# Patient Record
Sex: Female | Born: 1995 | Race: Black or African American | Hispanic: No | Marital: Single | Smoking: Never smoker
Health system: Southern US, Community
[De-identification: ages and names within clinical notes are randomized; demographics above are authoritative.]

## PROBLEM LIST (undated history)

## (undated) DIAGNOSIS — J020 Streptococcal pharyngitis: Secondary | ICD-10-CM

---

## 2017-03-07 ENCOUNTER — Ambulatory Visit (HOSPITAL_COMMUNITY)
Admission: EM | Admit: 2017-03-07 | Discharge: 2017-03-07 | Disposition: A | Discharge status: Home / Self Care | Payer: Medicaid Other | Attending: Family Medicine | Admitting: Family Medicine

## 2017-03-07 ENCOUNTER — Other Ambulatory Visit: Payer: Self-pay

## 2017-03-07 ENCOUNTER — Encounter (HOSPITAL_COMMUNITY): Payer: Self-pay | Admitting: *Deleted

## 2017-03-07 DIAGNOSIS — R0789 Other chest pain: Secondary | ICD-10-CM | POA: Diagnosis not present

## 2017-03-07 HISTORY — DX: Streptococcal pharyngitis: J02.0

## 2017-03-07 NOTE — Discharge Instructions (Signed)
Stop the azithromycin.    Mylanta or Maalox should help with the discomfort, which should resolve in 2 days

## 2017-03-07 NOTE — ED Provider Notes (Signed)
  [facility]   [id number] 03/07/17 Arrival Time: 1514   SUBJECTIVE:  Eileen Anderson is a 21 y.o. female who presents to the urgent care with complaint of constant left chest pain without radiation.  Occasionally feels SOB.  No change in pain with deep breathing; pain sometimes changes with movement.  She was diagnosed with strep throat on Thursday and started on azithromycin. She's had chest pain number since she started the azithromycin.  No longer has sore throat  Majoring in gaming   Past Medical History:  Diagnosis Date  . Strep pharyngitis    No family history on file. Social History   Socioeconomic History  . Marital status: Single    Spouse name: Not on file  . Number of children: Not on file  . Years of education: Not on file  . Highest education level: Not on file  Social Needs  . Financial resource strain: Not on file  . Food insecurity - worry: Not on file  . Food insecurity - inability: Not on file  . Transportation needs - medical: Not on file  . Transportation needs - non-medical: Not on file  Occupational History  . Not on file  Tobacco Use  . Smoking status: Never Smoker  Substance and Sexual Activity  . Alcohol use: No    Frequency: Never  . Drug use: Yes    Types: Marijuana  . Sexual activity: Yes    Birth control/protection: Pill  Other Topics Concern  . Not on file  Social History Narrative  . Not on file   Current Meds  Medication Sig  . UNKNOWN TO PATIENT BCPs   No Known Allergies    ROS: As per HPI, remainder of ROS negative.   OBJECTIVE:   Vitals:   03/07/17 1529  BP: 130/72  Pulse: 92  Resp: 16  Temp: 98.1 F (36.7 C)  TempSrc: Oral  SpO2: 100%     General appearance: alert; no distress Eyes: PERRL; EOMI; conjunctiva normal HENT: normocephalic; atraumatic; TMs normal, canal normal, external ears normal without trauma; nasal mucosa normal; oral mucosa normal Neck: supple Lungs: clear to auscultation  bilaterally Heart: regular rate and rhythm Abdomen: soft, non-tender; bowel sounds normal; no masses or organomegaly; no guarding or rebound tenderness Back: no CVA tenderness Extremities: no cyanosis or edema; symmetrical with no gross deformities Skin: warm and dry Neurologic: normal gait; grossly normal Psychological: alert and cooperative; normal mood and affect      Labs:  No results found for this or any previous visit.  Labs Reviewed - No data to display  No results found.     ASSESSMENT & PLAN:  1. Other chest pain     Meds ordered this encounter  Medications  . UNKNOWN TO PATIENT    Sig: BCPs    Reviewed expectations re: course of current medical issues. Questions answered. Outlined signs and symptoms indicating need for more acute intervention. Patient verbalized understanding. After Visit Summary given.    Procedures:      Elvina SidleLauenstein, Dewitt Judice, MD 03/07/17 1601

## 2017-03-07 NOTE — ED Triage Notes (Signed)
Pt finished last dose of azithromycin for strep throat today; States ever since starting abx, has had constant left chest pain without radiation.  Occasionally feels SOB.  No change in pain with deep breathing; pain sometimes changes with movement.

## 2017-06-08 DIAGNOSIS — M549 Dorsalgia, unspecified: Secondary | ICD-10-CM | POA: Insufficient documentation

## 2017-06-09 ENCOUNTER — Emergency Department (HOSPITAL_COMMUNITY)
Admission: EM | Admit: 2017-06-09 | Discharge: 2017-06-09 | Disposition: A | Discharge status: Home / Self Care | Payer: Medicaid Other | Attending: Emergency Medicine | Admitting: Emergency Medicine

## 2017-06-09 ENCOUNTER — Other Ambulatory Visit: Payer: Self-pay

## 2017-06-09 ENCOUNTER — Encounter (HOSPITAL_COMMUNITY): Payer: Self-pay | Admitting: Emergency Medicine

## 2017-06-09 DIAGNOSIS — M5489 Other dorsalgia: Secondary | ICD-10-CM

## 2017-06-09 LAB — POC URINE PREG, ED: Preg Test, Ur: NEGATIVE

## 2017-06-09 LAB — URINALYSIS, ROUTINE W REFLEX MICROSCOPIC
Bilirubin Urine: NEGATIVE
Glucose, UA: NEGATIVE mg/dL
Hgb urine dipstick: NEGATIVE
KETONES UR: NEGATIVE mg/dL
LEUKOCYTES UA: NEGATIVE
NITRITE: NEGATIVE
PH: 6 (ref 5.0–8.0)
Protein, ur: NEGATIVE mg/dL
SPECIFIC GRAVITY, URINE: 1.03 (ref 1.005–1.030)

## 2017-06-09 MED ORDER — CYCLOBENZAPRINE HCL 10 MG PO TABS: 10.0000 mg | ORAL_TABLET | Freq: Two times a day (BID) | ORAL | 0 refills | Status: DC | PRN

## 2017-06-09 NOTE — ED Provider Notes (Signed)
[facility]   CSN: [id number] Arrival date & time: 06/08/17  2343     History   Chief Complaint Chief Complaint  Patient presents with  . Back Pain    HPI Eileen Anderson is a 22 y.o. female.  Patient presents to the ED with a chief complaint back pain.  She states that she was squatting with a barbell today and felt a pain in her back.  She states that the pain radiates down her back.  She denies weakness or numbness.  Denies bowel or bladder incontinence.  Denies fever or chills.  Denies dysuria.  There are no aggravating or alleviating factors.   The history is provided by the patient. No language interpreter was used.    Past Medical History:  Diagnosis Date  . Strep pharyngitis     There are no active problems to display for this patient.   History reviewed. No pertinent surgical history.  OB History    No data available       Home Medications    Prior to Admission medications   Medication Sig Start Date End Date Taking? Authorizing Provider  UNKNOWN TO PATIENT BCPs    [provider]    Family History No family history on file.  Social History Social History   Tobacco Use  . Smoking status: Never Smoker  Substance Use Topics  . Alcohol use: No    Frequency: Never  . Drug use: Yes    Types: Marijuana     Allergies   Patient has no known allergies.   Review of Systems Review of Systems  All other systems reviewed and are negative.    Physical Exam Updated Vital Signs BP (!) 111/47 (BP Location: Right Arm)   Pulse 76   Temp 97.9 F (36.6 C) (Oral)   Resp 17   Ht 5\' 2"  (1.575 m)   Wt 66.7 kg (147 lb)   SpO2 95%   BMI 26.89 kg/m   Physical Exam Physical Exam  Constitutional: Pt appears well-developed and well-nourished. No distress.  HENT:  Head: Normocephalic and atraumatic.  Mouth/Throat: Oropharynx is clear and moist. No oropharyngeal exudate.  Eyes:  Conjunctivae are normal.  Neck: Normal range of motion. Neck supple.  No meningismus Cardiovascular: Normal rate, regular rhythm and intact distal pulses.   Pulmonary/Chest: Effort normal and breath sounds normal. No respiratory distress. Pt has no wheezes.  Abdominal: Pt exhibits no distension Musculoskeletal:  Lumbar paraspinal muscle tenderness to palpation, no bony CTLS spine tenderness, deformity, step-off, or crepitus Lymphadenopathy: Pt has no cervical adenopathy.  Neurological: Pt is alert and oriented Speech is clear and goal oriented, follows commands Normal 5/5 strength in upper and lower extremities bilaterally including dorsiflexion and plantar flexion, strong and equal grip strength Sensation intact Great toe extension intact Moves extremities without ataxia, coordination intact Ankle and knee jerk reflexes intact and symmetrical  Normal gait Normal balance No Clonus Skin: Skin is warm and dry. No rash noted. Pt is not diaphoretic. No erythema.  Psychiatric: Pt has a normal mood and affect. Behavior is normal.  Nursing note and vitals reviewed.   ED Treatments / Results  Labs (all labs ordered are listed, but only abnormal results are displayed) Labs Reviewed  URINALYSIS, ROUTINE W REFLEX MICROSCOPIC - Abnormal; Notable for the following components:      Result Value   APPearance HAZY (*)    All other components within normal limits  POC URINE PREG, ED  EKG  EKG Interpretation None       Radiology No results found.  Procedures Procedures (including critical care time)  Medications Ordered in ED Medications - No data to display   Initial Impression / Assessment and Plan / ED Course  I have reviewed the triage vital signs and the nursing notes.  Pertinent labs & imaging results that were available during my care of the patient were reviewed by me and considered in my medical decision making (see chart for details).     Patient with back pain.      No neurological deficits and normal neuro exam.  Patient is ambulatory.  No loss of bowel or bladder control.  Doubt cauda equina.  Denies fever,  doubt epidural abscess or other lesion. Recommend back exercises, stretching, RICE, and will treat with a short course of flexeril.  Encouraged the patient that there could be a need for additional workup and/or imaging such as MRI, if the symptoms do not resolve. Patient advised that if the back pain does not resolve, or radiates, this could progress to more serious conditions and is encouraged to follow-up with PCP or orthopedics within 2 weeks.     Final Clinical Impressions(s) / ED Diagnoses   Final diagnoses:  Midline back pain, unspecified back location, unspecified chronicity    ED Discharge Orders        Ordered    cyclobenzaprine (FLEXERIL) 10 MG tablet  2 times daily PRN     06/09/17 0505       Roxy Horseman, PA-C 06/09/17 0505    Palumbo, April, MD 06/09/17 [phone]

## 2017-06-09 NOTE — ED Triage Notes (Signed)
Pt has hx of scoiolis, was working out today and initially felt a "tingle" in her neck.  Since then she has had increasing pain in her lower back.  OTC medications have not helped w/ the pain.

## 2018-01-05 ENCOUNTER — Ambulatory Visit
Admission: RE | Admit: 2018-01-05 | Discharge: 2018-01-05 | Disposition: A | Discharge status: Home / Self Care | Payer: Medicaid Other | Source: Ambulatory Visit | Attending: Nurse Practitioner | Admitting: Nurse Practitioner

## 2018-01-05 ENCOUNTER — Other Ambulatory Visit: Payer: Self-pay | Admitting: Nurse Practitioner

## 2018-01-05 DIAGNOSIS — M419 Scoliosis, unspecified: Secondary | ICD-10-CM

## 2018-01-18 ENCOUNTER — Encounter (HOSPITAL_COMMUNITY): Payer: Self-pay

## 2018-01-18 ENCOUNTER — Ambulatory Visit (HOSPITAL_COMMUNITY)
Admission: EM | Admit: 2018-01-18 | Discharge: 2018-01-18 | Disposition: A | Discharge status: Home / Self Care | Payer: BLUE CROSS/BLUE SHIELD | Attending: Family Medicine | Admitting: Family Medicine

## 2018-01-18 ENCOUNTER — Other Ambulatory Visit: Payer: Self-pay

## 2018-01-18 DIAGNOSIS — M62830 Muscle spasm of back: Secondary | ICD-10-CM | POA: Diagnosis not present

## 2018-01-18 MED ORDER — IBUPROFEN 800 MG PO TABS: 800.0000 mg | ORAL_TABLET | Freq: Three times a day (TID) | ORAL | 0 refills | Status: DC

## 2018-01-18 MED ORDER — CYCLOBENZAPRINE HCL 5 MG PO TABS: 5.0000 mg | ORAL_TABLET | Freq: Three times a day (TID) | ORAL | 0 refills | Status: DC | PRN

## 2018-01-18 NOTE — ED Provider Notes (Signed)
[facility]    CSN: [id number] Arrival date & time: 01/18/18  1616     History   Chief Complaint Chief Complaint  Patient presents with  . Back Pain    HPI Eileen Anderson is a 22 y.o. female.   HPI  Patient is here for back pain.  Is been present for 2 weeks.  She is in [place].  They did a physical fitness test.  She was competing with other students.  She did activities that she was not accustomed to and had muscle pain the next day.  This has persisted. Pain at the mid to low back.  No numbness or weakness.  No bowel or bladder complaint.  She does have some underlying scoliosis.  Past Medical History:  Diagnosis Date  . Strep pharyngitis     There are no active problems to display for this patient.   History reviewed. No pertinent surgical history.  OB History   None      Home Medications    Prior to Admission medications   Medication Sig Start Date End Date Taking? Authorizing Provider  cyclobenzaprine (FLEXERIL) 5 MG tablet Take 1 tablet (5 mg total) by mouth 3 (three) times daily as needed for muscle spasms. 01/18/18   Eustace Moore, MD  ibuprofen (ADVIL,MOTRIN) 800 MG tablet Take 1 tablet (800 mg total) by mouth 3 (three) times daily. 01/18/18   Eustace Moore, MD  UNKNOWN TO PATIENT BCPs    [provider]    Family History History reviewed. No pertinent family history. No family history heart disease, cancer in parents Social History Social History   Tobacco Use  . Smoking status: Never Smoker  . Smokeless tobacco: Never Used  Substance Use Topics  . Alcohol use: No    Frequency: Never  . Drug use: Yes    Types: Marijuana     Allergies   Patient has no known allergies.   Review of Systems Review of Systems  Constitutional: Negative for chills and fever.  HENT: Negative for ear pain and sore throat.   Eyes: Negative for pain and visual disturbance.  Respiratory: Negative for cough and shortness of breath.     Cardiovascular: Negative for chest pain and palpitations.  Gastrointestinal: Negative for abdominal pain and vomiting.  Genitourinary: Negative for dysuria and hematuria.  Musculoskeletal: Positive for back pain. Negative for arthralgias.  Skin: Negative for color change and rash.  Neurological: Negative for seizures and syncope.  All other systems reviewed and are negative.    Physical Exam Triage Vital Signs ED Triage Vitals  Enc Vitals Group     BP 01/18/18 1643 (!) 114/55     Pulse Rate 01/18/18 1643 80     Resp 01/18/18 1643 20     Temp 01/18/18 1643 98 F (36.7 C)     Temp Source 01/18/18 1643 Oral     SpO2 01/18/18 1643 100 %     Weight 01/18/18 1700 152 lb (68.9 kg)     Height --      Head Circumference --      Peak Flow --      Pain Score --      Pain Loc --      Pain Edu? --      Excl. in GC? --    No data found.  Updated Vital Signs BP (!) 102/58 (BP Location: Right Arm)   Pulse 80   Temp 98.3 F (36.8 C) (Oral)   Resp  18   Wt 68.9 kg   LMP 01/18/2018   SpO2 100%   BMI 27.80 kg/m   Visual Acuity Right Eye Distance:   Left Eye Distance:   Bilateral Distance:    Right Eye Near:   Left Eye Near:    Bilateral Near:     Physical Exam  Constitutional: She appears well-developed and well-nourished. No distress.  HENT:  Head: Normocephalic and atraumatic.  Mouth/Throat: Oropharynx is clear and moist.  Eyes: Pupils are equal, round, and reactive to light. Conjunctivae are normal.  Neck: Normal range of motion.  Cardiovascular: Normal rate.  Pulmonary/Chest: Effort normal. No respiratory distress.  Abdominal: Soft. She exhibits no distension.  Musculoskeletal: Normal range of motion. She exhibits no edema.  Neurological: She is alert.  Lumbar spine is straight and symmetric. Full range of motion. No muscle spasm. Strength, sensation, range of motion, and reflexes are normal in both lower extremities. Straight leg raise is negative bilateral. Mild  tenderness lumbar muscles   Skin: Skin is warm and dry.  Psychiatric: She has a normal mood and affect. Her behavior is normal.     UC Treatments / Results  Labs (all labs ordered are listed, but only abnormal results are displayed) Labs Reviewed - No data to display  EKG None  Radiology No results found.  Procedures Procedures (including critical care time)  Medications Ordered in UC Medications - No data to display  Initial Impression / Assessment and Plan / UC Course  I have reviewed the triage vital signs and the nursing notes.  Pertinent labs & imaging results that were available during my care of the patient were reviewed by me and considered in my medical decision making (see chart for details).     Discussed no need for imaging with nontraumatic muscle pain Final Clinical Impressions(s) / UC Diagnoses   Final diagnoses:  Muscle spasm of back     Discharge Instructions     Take ibuprofen 3 times a day with food Take cyclobenzaprine as needed for muscle relaxing Caution drowsiness on the cyclobenzaprine Use ice or heat to sore back muscles Activity as tolerated.  Stretching and gentle movements are advised, no heavy lifting or heavy use of muscles for an additional week or 2 Return if you fail to improve as expected   ED Prescriptions    Medication Sig Dispense Auth. Provider   cyclobenzaprine (FLEXERIL) 5 MG tablet Take 1 tablet (5 mg total) by mouth 3 (three) times daily as needed for muscle spasms. 30 tablet Eustace Moore, MD   ibuprofen (ADVIL,MOTRIN) 800 MG tablet Take 1 tablet (800 mg total) by mouth 3 (three) times daily. 21 tablet Eustace Moore, MD     Controlled Substance Prescriptions [place] Controlled Substance Registry consulted? Not Applicable   Eustace Moore, MD 01/18/18 1759

## 2018-01-18 NOTE — ED Triage Notes (Signed)
Pt states she has back pain x 2 weeks.  

## 2018-01-18 NOTE — Discharge Instructions (Signed)
Take ibuprofen 3 times a day with food Take cyclobenzaprine as needed for muscle relaxing Caution drowsiness on the cyclobenzaprine Use ice or heat to sore back muscles Activity as tolerated.  Stretching and gentle movements are advised, no heavy lifting or heavy use of muscles for an additional week or 2 Return if you fail to improve as expected

## 2019-03-29 IMAGING — DX DG SCOLIOSIS EVAL COMPLETE SPINE 1V
1 series · 1 of 1 positions shown · non-contrast
Comparison: None.

CLINICAL DATA: Scoliosis evaluation.

EXAM:
DG SCOLIOSIS EVAL COMPLETE SPINE 1V

[dg scoliosis ap]
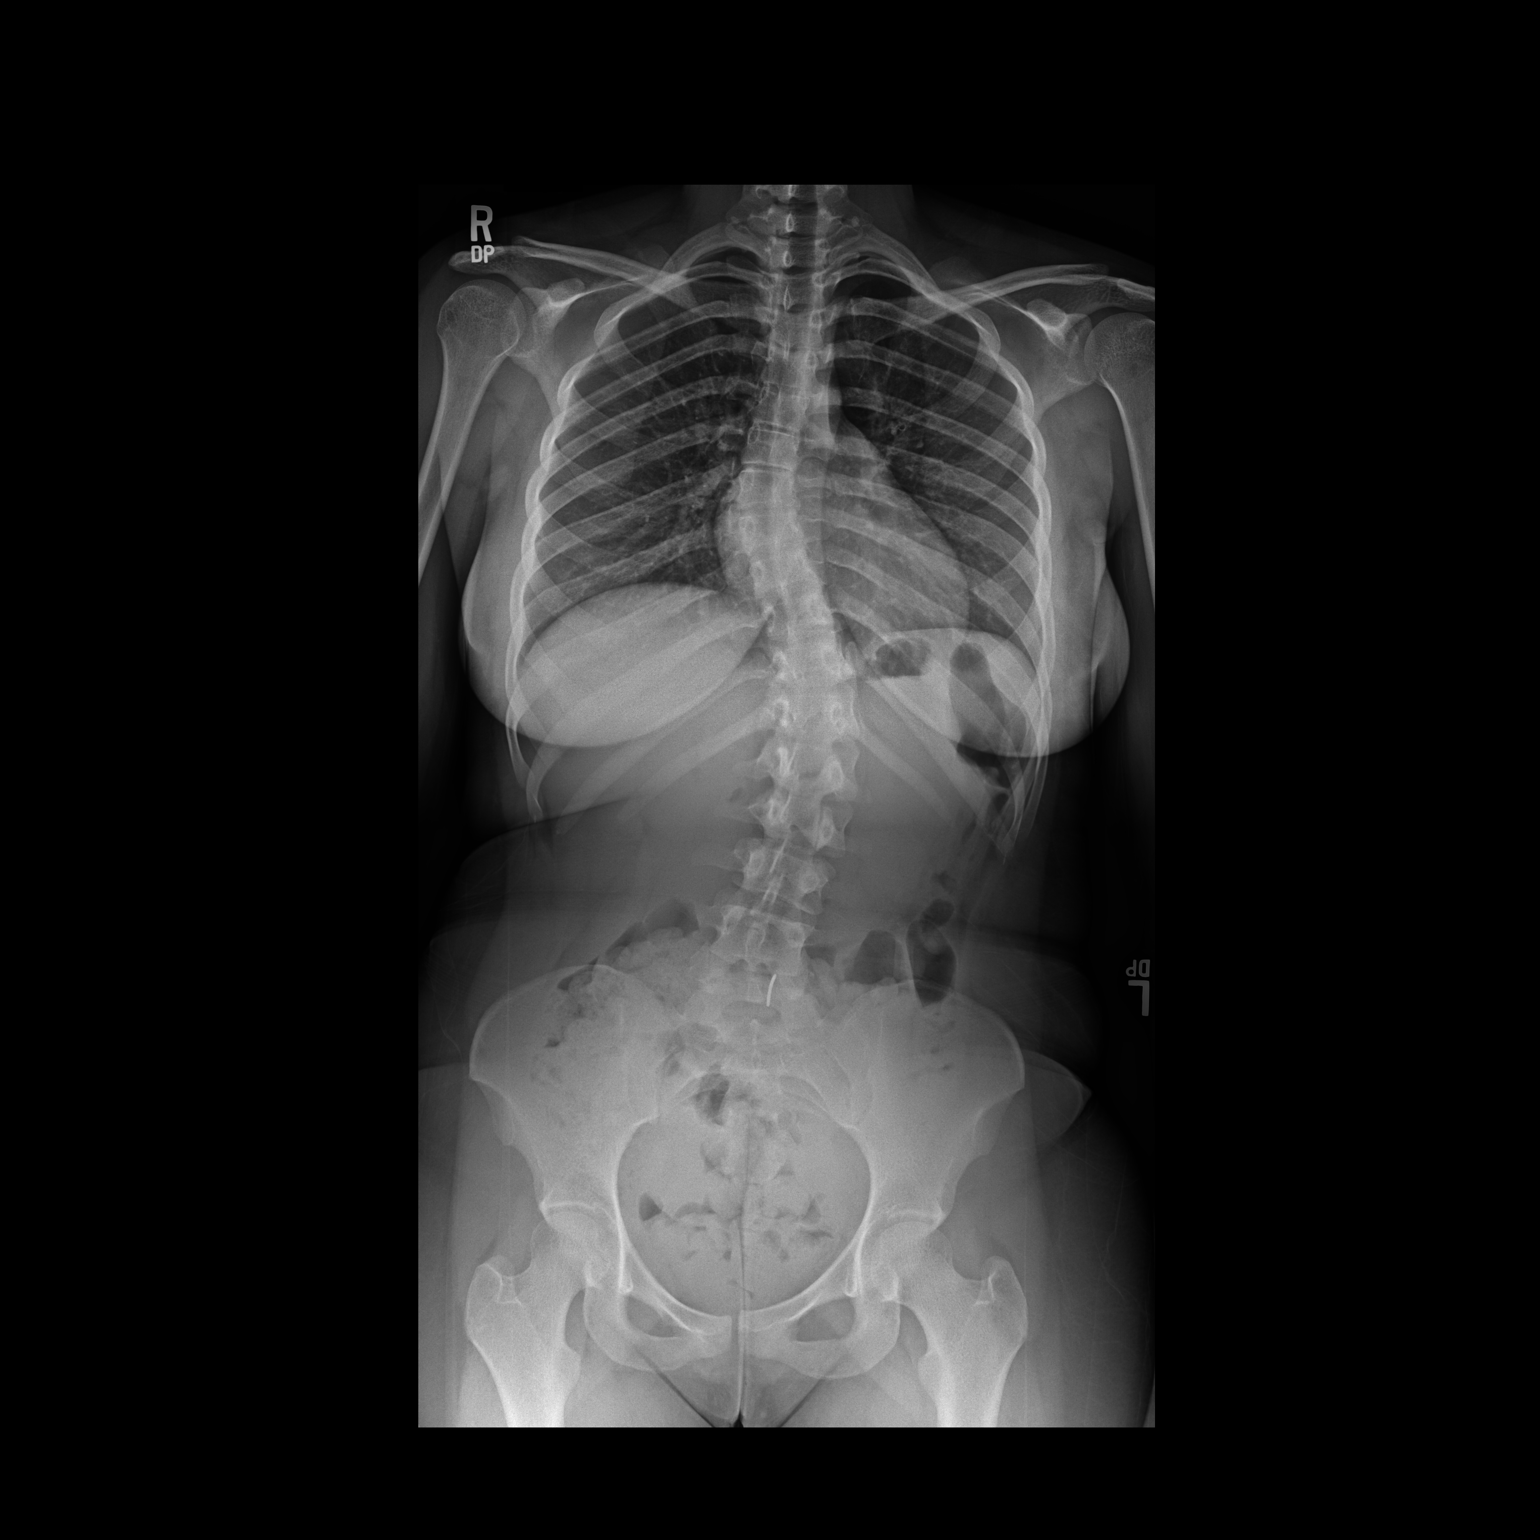

[1 of 1 positions shown; findings below may reference images not displayed]

FINDINGS: There is moderate S-shaped long segment thoracolumbar scoliosis. The
thoracic dextrocurvature component demonstrates a Cobb angle of 25
degrees as measured between the superior T5 and T10 vertebral
endplates. The lumbar levocurvature component demonstrates a Cobb
angle of 30 degrees as measured between the superior T11 and L3
vertebral endplates. No fracture or suspicious focal osseous lesion.
No evidence of segmentation anomalies.
IMPRESSION: Moderate S-shaped long segment thoracolumbar scoliosis as detailed.

## 2019-12-14 ENCOUNTER — Ambulatory Visit (HOSPITAL_COMMUNITY)
Admission: EM | Admit: 2019-12-14 | Discharge: 2019-12-14 | Disposition: A | Discharge status: Home / Self Care | Payer: BC Managed Care – PPO | Attending: Family Medicine | Admitting: Family Medicine

## 2019-12-14 ENCOUNTER — Other Ambulatory Visit: Payer: Self-pay

## 2019-12-14 ENCOUNTER — Encounter (HOSPITAL_COMMUNITY): Payer: Self-pay | Admitting: Emergency Medicine

## 2019-12-14 DIAGNOSIS — J069 Acute upper respiratory infection, unspecified: Secondary | ICD-10-CM

## 2019-12-14 DIAGNOSIS — U071 COVID-19: Secondary | ICD-10-CM | POA: Insufficient documentation

## 2019-12-14 LAB — SARS CORONAVIRUS 2 (TAT 6-24 HRS): SARS Coronavirus 2: POSITIVE — AB

## 2019-12-14 NOTE — Discharge Instructions (Signed)
Go home to rest Drink plenty of fluids Take Tylenol for pain or fever You may take over-the-counter cough and cold medicines as needed You must quarantine at home until your test result is available You can check for your test result in MyChart  

## 2019-12-14 NOTE — ED Triage Notes (Signed)
Pt c/o loss of taste and smell and nasal congestion since Sunday. Pt states she has been taking Nyquil and thera flu with no relief.

## 2019-12-14 NOTE — ED Provider Notes (Signed)
[facility]    CSN: [id number] Arrival date & time: 12/14/19  1027      History   Chief Complaint Chief Complaint  Patient presents with  . loss of taste/smell  . Nasal Congestion    HPI Eileen Anderson is a 24 y.o. female.   HPI  Patient here for loss of taste and smell, nasal congestion since Sunday (5 days).  No fever or chills.  No body aches or headache.  No shortness of breath, or cough. No known exposure to Covid Patient works at [employer] No one else at home is sick She has been taking over-the-counter cough and cold medicine with some improvement  Past Medical History:  Diagnosis Date  . Strep pharyngitis     There are no problems to display for this patient.   History reviewed. No pertinent surgical history.  OB History   No obstetric history on file.      Home Medications    Prior to Admission medications   Medication Sig Start Date End Date Taking? Authorizing Provider  Norethin Ace-Eth Estrad-FE 1-20 MG-MCG(24) CHEW Chew by mouth. 09/25/19  Yes [provider]  fluticasone (FLONASE) 50 MCG/ACT nasal spray 1 spray by Both Nostrils route daily.    [provider]    Family History Family History  Problem Relation Age of Onset  . Healthy Mother   . Healthy Father     Social History Social History   Tobacco Use  . Smoking status: Never Smoker  . Smokeless tobacco: Never Used  Vaping Use  . Vaping Use: Never used  Substance Use Topics  . Alcohol use: No  . Drug use: Not Currently     Allergies   Patient has no known allergies.   Review of Systems Review of Systems See HPI  Physical Exam Triage Vital Signs ED Triage Vitals  Enc Vitals Group     BP 12/14/19 1239 109/64     Pulse Rate 12/14/19 1239 64     Resp 12/14/19 1239 16     Temp 12/14/19 1239 97.8 F (36.6 C)     Temp Source 12/14/19 1239 Oral     SpO2 12/14/19 1239 100 %     Weight --      Height --      Head Circumference --      Peak  Flow --      Pain Score 12/14/19 1237 0     Pain Loc --      Pain Edu? --      Excl. in GC? --    No data found.  Updated Vital Signs BP 109/64 (BP Location: Left Arm)   Pulse 64   Temp 97.8 F (36.6 C) (Oral)   Resp 16   LMP 11/13/2019   SpO2 100%      Physical Exam Constitutional:      General: She is not in acute distress.    Appearance: She is well-developed and normal weight.  HENT:     Head: Normocephalic and atraumatic.     Nose: No congestion.     Mouth/Throat:     Mouth: Mucous membranes are moist.     Pharynx: No posterior oropharyngeal erythema.  Eyes:     Conjunctiva/sclera: Conjunctivae normal.     Pupils: Pupils are equal, round, and reactive to light.  Cardiovascular:     Rate and Rhythm: Normal rate and regular rhythm.     Heart sounds: Normal heart sounds.  Pulmonary:  Effort: Pulmonary effort is normal. No respiratory distress.     Breath sounds: No wheezing or rales.     Comments: Lungs are clear Musculoskeletal:        General: Normal range of motion.     Cervical back: Normal range of motion.  Skin:    General: Skin is warm and dry.  Neurological:     Mental Status: She is alert.  Psychiatric:        Behavior: Behavior normal.      UC Treatments / Results  Labs (all labs ordered are listed, but only abnormal results are displayed) Labs Reviewed  SARS CORONAVIRUS 2 (TAT 6-24 HRS)    EKG   Radiology No results found.  Procedures Procedures (including critical care time)  Medications Ordered in UC Medications - No data to display  Initial Impression / Assessment and Plan / UC Course  I have reviewed the triage vital signs and the nursing notes.  Pertinent labs & imaging results that were available during my care of the patient were reviewed by me and considered in my medical decision making (see chart for details).     Reviewed likely viral upper respiratory infection.  Possibly Covid.  Needs to quarantine until test  results are available.  If positive for Covid, needs to quarantine for 10 days after symptom onset.  My chart for test results is discussed Final Clinical Impressions(s) / UC Diagnoses   Final diagnoses:  Viral upper respiratory tract infection     Discharge Instructions     Go home to rest Drink plenty of fluids Take Tylenol for pain or fever You may take over-the-counter cough and cold medicines as needed You must quarantine at home until your test result is available You can check for your test result in MyChart    ED Prescriptions    None     PDMP not reviewed this encounter.   Eustace Moore, MD 12/14/19 1308
